# Patient Record
Sex: Male | Born: 1960 | Race: White | Hispanic: No | Marital: Married | Smoking: Current some day smoker
Health system: Southern US, Community
[De-identification: ages and names within clinical notes are randomized; demographics above are authoritative.]

## PROBLEM LIST (undated history)

## (undated) HISTORY — PX: FOOT SURGERY: SHX648

## (undated) HISTORY — PX: NECK SURGERY: SHX720

---

## 2006-04-22 ENCOUNTER — Emergency Department (HOSPITAL_COMMUNITY): Admission: EM | Admit: 2006-04-22 | Discharge: 2006-04-22 | Payer: Self-pay | Admitting: Emergency Medicine

## 2006-08-21 ENCOUNTER — Emergency Department (HOSPITAL_COMMUNITY): Admission: EM | Admit: 2006-08-21 | Discharge: 2006-08-21 | Payer: Self-pay | Admitting: Emergency Medicine

## 2006-10-05 ENCOUNTER — Emergency Department (HOSPITAL_COMMUNITY): Admission: EM | Admit: 2006-10-05 | Discharge: 2006-10-05 | Payer: Self-pay | Admitting: Emergency Medicine

## 2006-12-04 ENCOUNTER — Emergency Department (HOSPITAL_COMMUNITY): Admission: EM | Admit: 2006-12-04 | Discharge: 2006-12-04 | Payer: Self-pay | Admitting: Emergency Medicine

## 2007-05-03 ENCOUNTER — Emergency Department (HOSPITAL_COMMUNITY): Admission: EM | Admit: 2007-05-03 | Discharge: 2007-05-03 | Payer: Self-pay | Admitting: Emergency Medicine

## 2007-12-06 ENCOUNTER — Emergency Department (HOSPITAL_COMMUNITY): Admission: EM | Admit: 2007-12-06 | Discharge: 2007-12-06 | Payer: Self-pay | Admitting: Emergency Medicine

## 2008-06-26 ENCOUNTER — Emergency Department (HOSPITAL_COMMUNITY): Admission: EM | Admit: 2008-06-26 | Discharge: 2008-06-26 | Payer: Self-pay | Admitting: Emergency Medicine

## 2009-07-04 ENCOUNTER — Encounter: Admission: RE | Admit: 2009-07-04 | Discharge: 2009-07-04 | Payer: Self-pay | Admitting: Neurosurgery

## 2009-10-24 ENCOUNTER — Ambulatory Visit (HOSPITAL_COMMUNITY): Admission: RE | Admit: 2009-10-24 | Discharge: 2009-10-24 | Payer: Self-pay | Admitting: Neurosurgery

## 2009-11-13 ENCOUNTER — Encounter: Admission: RE | Admit: 2009-11-13 | Discharge: 2009-11-13 | Payer: Self-pay | Admitting: Neurosurgery

## 2010-06-01 LAB — CBC
Hemoglobin: 14.6 g/dL (ref 13.0–17.0)
Platelets: 226 10*3/uL (ref 150–400)
RBC: 4.47 MIL/uL (ref 4.22–5.81)
WBC: 6 10*3/uL (ref 4.0–10.5)

## 2010-06-01 LAB — SURGICAL PCR SCREEN: MRSA, PCR: NEGATIVE

## 2010-07-28 ENCOUNTER — Emergency Department (HOSPITAL_COMMUNITY)
Admission: EM | Admit: 2010-07-28 | Discharge: 2010-07-28 | Disposition: A | Discharge status: Home / Self Care | Payer: PRIVATE HEALTH INSURANCE | Attending: Emergency Medicine | Admitting: Emergency Medicine

## 2010-07-28 ENCOUNTER — Emergency Department (HOSPITAL_COMMUNITY): Payer: PRIVATE HEALTH INSURANCE

## 2010-07-28 DIAGNOSIS — S62639A Displaced fracture of distal phalanx of unspecified finger, initial encounter for closed fracture: Secondary | ICD-10-CM | POA: Insufficient documentation

## 2010-07-28 DIAGNOSIS — W230XXA Caught, crushed, jammed, or pinched between moving objects, initial encounter: Secondary | ICD-10-CM | POA: Insufficient documentation

## 2010-07-28 DIAGNOSIS — S61209A Unspecified open wound of unspecified finger without damage to nail, initial encounter: Secondary | ICD-10-CM | POA: Insufficient documentation

## 2010-12-07 LAB — BASIC METABOLIC PANEL
BUN: 9
CO2: 25
Calcium: 8.4
Chloride: 104
Creatinine, Ser: 0.58
GFR calc Af Amer: >60
GFR calc non Af Amer: >60
Glucose, Bld: 120 — ABNORMAL HIGH
Potassium: 4
Sodium: 136

## 2010-12-07 LAB — CSF CELL COUNT WITH DIFFERENTIAL
RBC Count, CSF: 1290 — ABNORMAL HIGH
RBC Count, CSF: 190 — ABNORMAL HIGH
Tube #: 1
Tube #: 4
WBC, CSF: 0
WBC, CSF: 0

## 2010-12-07 LAB — DIFFERENTIAL
Basophils Absolute: 0
Basophils Relative: 1
Eosinophils Absolute: 0.2
Eosinophils Relative: 3
Lymphocytes Relative: 10 — ABNORMAL LOW
Lymphs Abs: 0.5 — ABNORMAL LOW
Monocytes Absolute: 0.5
Monocytes Relative: 11
Neutro Abs: 3.7
Neutrophils Relative %: 76

## 2010-12-07 LAB — INFLUENZA A+B VIRUS AG-DIRECT(RAPID)
Inflenza A Ag: NEGATIVE
Influenza B Ag: NEGATIVE

## 2010-12-07 LAB — CSF CULTURE W GRAM STAIN
Culture: NO GROWTH
Gram Stain: NONE SEEN

## 2010-12-07 LAB — CBC
HCT: 36.4 — ABNORMAL LOW
Hemoglobin: 12.9 — ABNORMAL LOW
MCHC: 35.4
MCV: 92.7
Platelets: 211
RBC: 3.92 — ABNORMAL LOW
RDW: 13.6
WBC: 4.9

## 2010-12-07 LAB — GLUCOSE, CSF: Glucose, CSF: 58

## 2010-12-07 LAB — PROTEIN, CSF: Total  Protein, CSF: 39

## 2010-12-31 LAB — URINALYSIS, ROUTINE W REFLEX MICROSCOPIC
Nitrite: NEGATIVE
Protein, ur: NEGATIVE
Specific Gravity, Urine: 1.02
Urobilinogen, UA: 1

## 2010-12-31 LAB — DIFFERENTIAL
Basophils Relative: 0
Lymphs Abs: 2.4
Monocytes Absolute: 0.6
Neutro Abs: 3.6
Neutrophils Relative %: 53

## 2010-12-31 LAB — CBC
HCT: 46.2
Hemoglobin: 15.9
MCHC: 34.3
MCV: 94.1
Platelets: 305
RBC: 4.92
RDW: 13.6

## 2010-12-31 LAB — BASIC METABOLIC PANEL
BUN: 11
CO2: 29
Chloride: 102
Creatinine, Ser: 0.69
GFR calc non Af Amer: >60
Potassium: 4.3
Sodium: 137

## 2010-12-31 LAB — RAPID URINE DRUG SCREEN, HOSP PERFORMED
Barbiturates: NOT DETECTED
Opiates: NOT DETECTED

## 2011-08-09 ENCOUNTER — Ambulatory Visit
Admission: RE | Admit: 2011-08-09 | Discharge: 2011-08-09 | Disposition: A | Discharge status: Home / Self Care | Payer: PRIVATE HEALTH INSURANCE | Source: Ambulatory Visit | Attending: Physician Assistant | Admitting: Physician Assistant

## 2011-08-09 ENCOUNTER — Other Ambulatory Visit: Payer: Self-pay | Admitting: Physician Assistant

## 2011-08-09 DIAGNOSIS — M549 Dorsalgia, unspecified: Secondary | ICD-10-CM

## 2013-04-14 ENCOUNTER — Encounter (HOSPITAL_COMMUNITY): Payer: Self-pay | Admitting: Emergency Medicine

## 2013-04-14 ENCOUNTER — Emergency Department (HOSPITAL_COMMUNITY)
Admission: EM | Admit: 2013-04-14 | Discharge: 2013-04-14 | Disposition: A | Discharge status: Home / Self Care | Payer: PRIVATE HEALTH INSURANCE | Attending: Emergency Medicine | Admitting: Emergency Medicine

## 2013-04-14 DIAGNOSIS — S0993XA Unspecified injury of face, initial encounter: Secondary | ICD-10-CM | POA: Insufficient documentation

## 2013-04-14 DIAGNOSIS — Z9889 Other specified postprocedural states: Secondary | ICD-10-CM | POA: Insufficient documentation

## 2013-04-14 DIAGNOSIS — M542 Cervicalgia: Secondary | ICD-10-CM

## 2013-04-14 DIAGNOSIS — M62838 Other muscle spasm: Secondary | ICD-10-CM | POA: Insufficient documentation

## 2013-04-14 DIAGNOSIS — X500XXA Overexertion from strenuous movement or load, initial encounter: Secondary | ICD-10-CM | POA: Insufficient documentation

## 2013-04-14 DIAGNOSIS — S0990XA Unspecified injury of head, initial encounter: Secondary | ICD-10-CM | POA: Insufficient documentation

## 2013-04-14 DIAGNOSIS — Y9389 Activity, other specified: Secondary | ICD-10-CM | POA: Insufficient documentation

## 2013-04-14 DIAGNOSIS — Y929 Unspecified place or not applicable: Secondary | ICD-10-CM | POA: Insufficient documentation

## 2013-04-14 DIAGNOSIS — S199XXA Unspecified injury of neck, initial encounter: Principal | ICD-10-CM

## 2013-04-14 DIAGNOSIS — IMO0002 Reserved for concepts with insufficient information to code with codable children: Secondary | ICD-10-CM | POA: Insufficient documentation

## 2013-04-14 DIAGNOSIS — Z79899 Other long term (current) drug therapy: Secondary | ICD-10-CM | POA: Insufficient documentation

## 2013-04-14 DIAGNOSIS — F17200 Nicotine dependence, unspecified, uncomplicated: Secondary | ICD-10-CM | POA: Insufficient documentation

## 2013-04-14 MED ORDER — NAPROXEN 500 MG PO TABS: 500.0000 mg | ORAL_TABLET | Freq: Two times a day (BID) | ORAL | Status: DC

## 2013-04-14 MED ORDER — DIAZEPAM 5 MG PO TABS
5.0000 mg | ORAL_TABLET | Freq: Once | ORAL | Status: AC
  Administered 2013-04-14: 5 mg via ORAL
  Filled 2013-04-14: qty 1

## 2013-04-14 MED ORDER — HYDROCODONE-ACETAMINOPHEN 5-325 MG PO TABS: 1.0000 | ORAL_TABLET | Freq: Four times a day (QID) | ORAL | Status: DC | PRN

## 2013-04-14 MED ORDER — KETOROLAC TROMETHAMINE 60 MG/2ML IM SOLN
60.0000 mg | Freq: Once | INTRAMUSCULAR | Status: AC
  Administered 2013-04-14: 60 mg via INTRAMUSCULAR
  Filled 2013-04-14: qty 2

## 2013-04-14 MED ORDER — HYDROMORPHONE HCL PF 1 MG/ML IJ SOLN
1.0000 mg | Freq: Once | INTRAMUSCULAR | Status: AC
  Administered 2013-04-14: 1 mg via INTRAMUSCULAR
  Filled 2013-04-14: qty 1

## 2013-04-14 MED ORDER — METHOCARBAMOL 500 MG PO TABS: 500.0000 mg | ORAL_TABLET | Freq: Two times a day (BID) | ORAL | Status: DC

## 2013-04-14 NOTE — ED Notes (Signed)
Pt reports pain to the R side of his neck to his R shoulder and up to his head that started 2 weeks ago and became worse this morning. Pt has limited movement in turing neck from side to side and touching chin to neck. Previous surgery to correct a pinched nerve three years ago. Pt states that he has had a headache for about 3 weeks that is "not as bad but it throbs".

## 2013-04-14 NOTE — ED Provider Notes (Signed)
Medical screening examination/treatment/procedure(s) were performed by non-physician practitioner and as supervising physician I was immediately available for consultation/collaboration.  EKG Interpretation   None         Sunnie NielsenBrian Kerisha Goughnour, MD 04/14/13 2258

## 2013-04-14 NOTE — ED Provider Notes (Signed)
CSN: [id number]     Arrival date & time 04/14/13  0559 History   None    Chief Complaint  Patient presents with  . Neck Pain   (Consider location/radiation/quality/duration/timing/severity/associated sxs/prior Treatment) Patient is a 53 y.o. male presenting with neck pain.  Neck Pain Associated symptoms: no numbness, no photophobia and no weakness    53 yo male with PMH significant for Neck surgery 3 years ago presents with gradual onset of RIGHT sided neck pain that has worsened over the course of 3 weeks and now currently rated at 10/10. Patient describes turing his head and noted a popping sensation prior to the onset of his pain.  Pain is throbbing and states it feels like the "muscles are pulling". Patient has tried OTC aleve intermittently and hot pads with little relief. Patient denies any recent trauma or injury. Admits to decreased AROM due to pain and tightness. Admits to "slight" HA today. Denies fever/chills, recent illness, or sick contacts.  History reviewed. No pertinent past medical history. Past Surgical History  Procedure Laterality Date  . Neck surgery    . Foot surgery     History reviewed. No pertinent family history. History  Substance Use Topics  . Smoking status: Current Some Day Smoker  . Smokeless tobacco: Not on file  . Alcohol Use: No    Review of Systems  HENT: Negative for rhinorrhea.   Eyes: Negative for photophobia, pain and visual disturbance.  Musculoskeletal: Positive for back pain, neck pain and neck stiffness.  Skin: Negative for rash.  Neurological: Negative for weakness and numbness.    Allergies  Review of patient's allergies indicates no known allergies.  Home Medications   Current Outpatient Rx  Name  Route  Sig  Dispense  Refill  . Naproxen Sodium (ALEVE) 220 MG CAPS   Oral   Take 440 mg by mouth daily as needed. For pain         . varenicline (CHANTIX) 1 MG tablet   Oral   Take 1 mg by mouth 2 (two) times daily.          Marland Kitchen HYDROcodone-acetaminophen (NORCO) 5-325 MG per tablet   Oral   Take 1-2 tablets by mouth every 6 (six) hours as needed for moderate pain or severe pain.   10 tablet   0   . methocarbamol (ROBAXIN) 500 MG tablet   Oral   Take 1 tablet (500 mg total) by mouth 2 (two) times daily.   20 tablet   0   . naproxen (NAPROSYN) 500 MG tablet   Oral   Take 1 tablet (500 mg total) by mouth 2 (two) times daily.   30 tablet   0    BP 133/81  Pulse 72  Temp(Src) 98.3 F (36.8 C) (Oral)  Resp 20  Ht 5\' 7"  (1.702 m)  Wt 165 lb (74.844 kg)  BMI 25.84 kg/m2  SpO2 99% Physical Exam  Nursing note and vitals reviewed. Constitutional: He is oriented to person, place, and time. He appears well-developed and well-nourished. No distress.  HENT:  Head: Normocephalic and atraumatic.  Eyes: Conjunctivae and EOM are normal.  Neck: Trachea normal and phonation normal. Neck supple. No JVD present. Muscular tenderness (Right Trapezius. Notable tightness of Right musculature as compared to Left side. ) present. No spinous process tenderness present. Carotid bruit is not present. Decreased range of motion present. No edema and no erythema present. No mass and no thyromegaly present.  Cardiovascular: Normal rate and regular rhythm.  Exam reveals no gallop and no friction rub.   No murmur heard. Pulmonary/Chest: Effort normal and breath sounds normal. No respiratory distress. He has no wheezes. He has no rales.  Musculoskeletal: He exhibits no edema.  Neurological: He is alert and oriented to person, place, and time. He has normal strength. No cranial nerve deficit or sensory deficit.  Skin: Skin is warm and dry. He is not diaphoretic.  Psychiatric: He has a normal mood and affect. His behavior is normal.    ED Course  Procedures (including critical care time) Labs Review Labs Reviewed - No data to display Imaging Review No results found.  EKG Interpretation   None       MDM   1. Neck pain  on right side   2. Muscle spasms of neck    Patient afebrile with normal VS.  No hx of trauma.  Patient appears in NAD and non ill appearing. Doubt meningitis given hx and exam.   Plan to treat patient's pain in ED and send home with PO meds. Written out of work for today. Advised rest and light stretching as tolerated, with follow up with PCP in 2 days. Patient agrees with plan. Discharged good condition.   Meds given in ED:  Medications  ketorolac (TORADOL) injection 60 mg (60 mg Intramuscular Given 04/14/13 0708)  HYDROmorphone (DILAUDID) injection 1 mg (1 mg Intramuscular Given 04/14/13 0708)  diazepam (VALIUM) tablet 5 mg (5 mg Oral Given 04/14/13 0708)    New Prescriptions   HYDROCODONE-ACETAMINOPHEN (NORCO) 5-325 MG PER TABLET    Take 1-2 tablets by mouth every 6 (six) hours as needed for moderate pain or severe pain.   METHOCARBAMOL (ROBAXIN) 500 MG TABLET    Take 1 tablet (500 mg total) by mouth 2 (two) times daily.   NAPROXEN (NAPROSYN) 500 MG TABLET    Take 1 tablet (500 mg total) by mouth 2 (two) times daily.      Allen NorrisJacob Gray WaresboroLackey, PA-C 04/14/13 [phone]

## 2013-04-14 NOTE — Discharge Instructions (Signed)
Follow up with your primary care doctor in 2 days to ensure symptom improvement. Take medications as directed. Do not work or drive when taking prescription pain medication. Return to the ED should your symptoms worsen before being able to be seen by primary care doctor. Recommend light neck stretches as tolerated.    Emergency Department Resource Guide 1) Find a Doctor and Pay Out of Pocket Although you won't have to find out who is covered by your insurance plan, it is a good idea to ask around and get recommendations. You will then need to call the office and see if the doctor you have chosen will accept you as a new patient and what types of options they offer for patients who are self-pay. Some doctors offer discounts or will set up payment plans for their patients who do not have insurance, but you will need to ask so you aren't surprised when you get to your appointment.  2) Contact Your Local Health Department Not all health departments have doctors that can see patients for sick visits, but many do, so it is worth a call to see if yours does. If you don't know where your local health department is, you can check in your phone book. The CDC also has a tool to help you locate your state's health department, and many state websites also have listings of all of their local health departments.  3) Find a Walk-in Clinic If your illness is not likely to be very severe or complicated, you may want to try a walk in clinic. These are popping up all over the country in pharmacies, drugstores, and shopping centers. They're usually staffed by nurse practitioners or physician assistants that have been trained to treat common illnesses and complaints. They're usually fairly quick and inexpensive. However, if you have serious medical issues or chronic medical problems, these are probably not your best option.  No Primary Care Doctor: - Call Health Connect at  [phone] - they can help you locate a primary care  doctor that  accepts your insurance, provides certain services, etc. - Physician Referral Service- [phone]  Chronic Pain Problems: Organization         Address  Phone   Notes  [facility]  [phone] Patients need to be referred by their primary care doctor.   Medication Assistance: Organization         Address  Phone   Notes  [facility] Medication [facility] [address] [place] [place], [place] [address] [phone] --Must be a resident of [facility] -- Must have NO insurance coverage whatsoever (no Medicaid/ Medicare, etc.) -- The pt. MUST have a primary care doctor that directs their care regularly and follows them in the community   [facility]  [phone]   [employer]  [phone]    Agencies that provide inexpensive medical care: Organization         Address  Phone   Notes  [facility]  [phone]   [facility]    [phone]   [facility] [address] [place], [place] [address] [phone]   [facility] [address] [place] [place] [phone]   Planned Parenthood    [phone]   [facility]    [phone]   [facility]  [address], [place] Phone:  [phone], Fax:  [phone] Hours of  Operation:  9 am - 6 pm, M-F.  Also accepts Medicaid/Medicare and self-pay.  [facility] for [place], [address], [place] Phone: [phone], Fax: [phone]. Hours of Operation:  8:30 am - 5:30 pm, M-F.  Also accepts Medicaid and self-pay.  [facility] [place] [address] [place] Phone: [phone]   [place], [place], [place] [phone], Ext. [phone] Mondays & Thursdays: 7-9 AM.  First 15 patients are seen on a first come, first serve basis.    [place]  Providers:  Organization         Address  Phone   Notes  [facility] [address], [place] [phone] Also accepts self-pay patients.  [facility] [address] [place], [place]  [phone]   [place], [address], [place] [phone]   [facility] [address] [place] [phone]   Lucianne Lei [address], [place]   [phone] Only accepts [place] Access [place] patients after they have their name applied to their card.   Self-Pay (no insurance) in [facility]:  Organization         Address  Phone   Notes  Sickle Cell Patients, [facility] Internal Medicine [place] [phone]   [facility] [place] [phone]   [facility] [place]  [place], [place], [place] [phone]   [facility]/Dr. Osei-Bonsu  [address] [place] or [address], [place] [phone] Phone number for both [place] and [place] locations is the same.  [facility] [address] [place] [phone]   [facility] [address] [place] or [address] [phone]   [facility] [address] [place] [phone], phone; [phone], fax Sees patients 1st and 3rd Saturday of every month.  Must not qualify for public or private insurance (i.e. Medicaid, Medicare, [place] Health Choice, Veterans' Benefits)  Household income should be no more than 200% of the poverty level The clinic cannot treat you if you are pregnant or think you are pregnant  Sexually transmitted diseases are not treated at the clinic.    Dental Care: Organization         Address  Phone  Notes  [facility] [place] [phone] Accepts children up to age 91 who are enrolled in [place] or [place]; pregnant women with a Medicaid card; and children who have applied for Medicaid or [place] Health Choice, but were declined, whose parents can pay a reduced fee at time of service.  [facility] Department of [facility]  [address], [place] [phone] Accepts children up to age 110 who are enrolled in [place] or [place]; pregnant women with a Medicaid card; and children who have applied for Medicaid or [place] Health Choice, but were declined, whose parents can pay a reduced fee at time of service.  [facility] Adult [facility] Access PRO[facility]  [place] [phone] Patients are seen by appointment only. Walk-ins are not accepted. [place] will see patients  101 years of age and older. Monday - Tuesday (8am-5pm) Most Wednesdays (8:30-5pm) $30 per visit, cash only  [facility] Adult Dental Access PROGRAM  [address], [facility] [phone] Patients are seen by appointment only. Walk-ins are not accepted. [place] will see patients 9 years of age and older. One Wednesday Evening (Monthly: Volunteer Based).  $30 per visit, cash only  [place]  [phone] for adults; Children under age 42, call Graduate Pediatric Dentistry at [phone]. Children aged 42-14, please call [phone] to request a pediatric application.  Dental services are provided in all areas of dental care including fillings, crowns and bridges, complete and partial dentures, implants, gum treatment, root canals, and extractions. Preventive care is also provided. Treatment is provided to both adults and children. Patients are selected via a lottery and there is often a waiting list.   [facility] [address] [place]  [phone] [email or web address]   [facility]  [address] [place], [place] [phone], Ext. [phone] Second and Fourth Thursday of each month, opens at 6:30 AM; Clinic ends at 9 AM.  Patients are seen on a first-come first-served basis, and a limited number are seen during each clinic.   [facility]  [address] Hillard Danker [place], [place] [phone]   Eligibility Requirements You must have lived in [place], [place], or [place] counties for at least the last three months.   You cannot be eligible for state or federal sponsored [employer], including [employer], [place], or [employer].   You generally cannot be eligible for healthcare insurance through your employer.    How to apply: Eligibility screenings are held every Tuesday and Wednesday afternoon from 1:00 pm until 4:00 pm. You do not need an appointment for the interview!  [facility] [address] [place], [place]   [place]  [place] Department  [place]  [phone]    Behavioral Health Resources in the Community: Intensive Outpatient Programs Organization         Address  Phone  Notes  [facility]. [address], [place], [place] [phone]   [facility]patient [address], [place], [place]   ADS: Alcohol & Drug Svcs [address], [place], [place]   [place] [address]  [place], [place] or [phone]   Substance Abuse Resources Organization         Address  Phone  Notes  Alcohol and Drug Services  [phone]   [place]  [phone]   [facility] [place]   Chinita Pester  [phone]   Residential & Outpatient Substance Abuse Program  [phone]   Psychological Services Organization         Address  Phone  Notes  [facility]  [place]  [phone]   [place] [address] [place] or [phone]    Mobile Crisis Teams Organization         Address  Phone  Notes  [facility], Mobile Crisis Care Unit  [phone]   [facility]  [address] [place], [place]   [facility] [address] [place] [phone]    Self-Help/Support Groups Organization  Address  Phone             Notes  Mental Health Assoc. of  - variety of support groups  336- [phone] Call for more information  Narcotics Anonymous (NA), [facility] [address], [place]  2 meetings at this location   Statisticianesidential Treatment Programs Organization         Address  Phone  Notes  [facility] [address],    [place]  [phone]   [facility]  [address], [place] [phone], [place], [place] [phone]   [facility] [address], [place] [phone] Admissions: 8am-3pm M-F  [facility] [address]    [place], [place] [phone]   [facility] [address], [place], [place] [phone]   [facility] [address]  [place], [place] [phone]   [facility] - Intensive Outpatient [address], [place], [place] [phone]   [facility] [address]  [place], [place] [phone] or [phone]   [facility] (RTS) [address] [place], [place] [phone] Accepts Medicaid  [facility] [address]  [place] [phone] Substance Abuse/Addiction Treatment   [facility] Behavioral Health Resources Organization         Address  Phone  Notes  [facility]  [phone]) [phone]   Angie FavaJulie Brannon, PhD [address] [place] [address], [address] [place], [place]   [phone] or [phone](336) [phone]    [facility]   [address] [place], [place] [phone]) [phone]   [facility] [address], [place], [place] [phone] Insurance/Medicaid/sponsorship through [facility]  [facility] Families [address]                                    [place], [place] [phone]) [phone] Therapy/tele-psych/case  [facility] [address]   [place], [place] [phone]) [phone]    Dr. Lolly MustacheArfeen  [phone]   [facility]  [employer] [facility] 1) [address], [place] 2) [address], [place] 3)  [address], [place] [phone]) [phone]  [phone]   [facility] Child Abuse Hotline [phone](336) [phone] or [phone]) [phone] (After Hours)     =

## 2015-06-19 ENCOUNTER — Ambulatory Visit (INDEPENDENT_AMBULATORY_CARE_PROVIDER_SITE_OTHER): Payer: PRIVATE HEALTH INSURANCE

## 2015-06-19 ENCOUNTER — Ambulatory Visit (INDEPENDENT_AMBULATORY_CARE_PROVIDER_SITE_OTHER): Payer: PRIVATE HEALTH INSURANCE | Admitting: Physician Assistant

## 2015-06-19 VITALS — BP 132/76 | HR 67 | Temp 98.0°F | Resp 16 | Ht 71.0 in | Wt 155.0 lb

## 2015-06-19 DIAGNOSIS — M25562 Pain in left knee: Secondary | ICD-10-CM

## 2015-06-19 MED ORDER — TRAMADOL HCL 50 MG PO TABS: 50.0000 mg | ORAL_TABLET | Freq: Three times a day (TID) | ORAL | Status: AC | PRN

## 2015-06-19 NOTE — Progress Notes (Signed)
Subjective:   Patient ID: Jeremy Herring, male     DOB: 1960-07-24, 55 y.o.    MRN: [id number]  PCP: No PCP Per Patient  Chief Complaint  Patient presents with  . Knee Injury    x Sat. left knee    HPI  Presents for evaluation of left knee pain.   Patient states he was mowing the grass on Saturday (06/17/15) when a rock flew out of the lawnmower and hit his left knee. He sustained a small puncture wound to the medial inferior aspect of the patella with small amount of bruising. He elevated and iced is left leg with little relief. He tried ibuprofen yesterday without relief. Walking makes the pain worse. Nothing makes better. He rates the pain a 9/10. 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 Denies fever, chills, cp, sob, numbness/tingling of LLE. Endorses pain, bruising, and edema of left knee. He is unable to bear weight on his left leg and ROM is restricted. Patient take no medications regularly and has no allergies.    NO CURRENT MEDICATIONS.   No Known Allergies   There are no active problems to display for this patient.    History reviewed. No pertinent family history.   Social History   Social History  . Marital Status: Married    Spouse Name: N/A  . Number of Children: 2  . Years of Education: N/A   Occupational History  . Construction    Social History Main Topics  . Smoking status: Current Some Day Smoker -- 1.00 packs/day for 20 years    Types: Cigarettes  . Smokeless tobacco: Never Used  . Alcohol Use: 7.2 oz/week    12 Cans of beer per week  . Drug Use: No  . Sexual Activity: Yes   Other Topics Concern  . Not on file   Social History Narrative   Live at home with wife        Review of Systems Constitutional: Negative for fever and chills.  Respiratory: Negative for shortness of breath.  Cardiovascular: Negative for chest pain.  Gastrointestinal: Negative for abdominal pain.  Musculoskeletal:    Unable to bear significant weight on left leg  Skin:   Bruising and swelling inferiormedial to left patella       Objective:  Physical Exam  Constitutional: He is oriented to person, place, and time. He appears well-developed and well-nourished. He is active and cooperative. No distress.  BP 132/76 mmHg  Pulse 67  Temp(Src) 98 F (36.7 C)  Resp 16  Ht  (1.803 m)  Wt 155 lb (70.308 kg)  BMI 21.63 kg/m2   Eyes: Conjunctivae are normal.  Cardiovascular:  Pulses:      Posterior tibial pulses are 2+ on the left side.  Pulmonary/Chest: Effort normal.  Musculoskeletal:       Left knee: He exhibits decreased range of motion (due to pain), swelling (inferomedially), ecchymosis and bony tenderness. He exhibits no effusion, no deformity, no erythema, normal alignment, no LCL laxity, normal patellar mobility, normal meniscus and no MCL laxity. Lacerations: small eschar inferomedially, consistent with wound from object as described. Tenderness found. Medial joint line and patellar tendon tenderness noted. No lateral joint line, no MCL and no LCL tenderness noted.       Legs: Neurological: He is alert and oriented to person, place, and time.  Skin: Ecchymosis and lesion (wound with eschar) noted. No rash noted. No erythema.  Psychiatric:  He has a normal mood and affect. His speech is normal and behavior is normal.    Dg Knee Complete 4 Views Left  06/19/2015  CLINICAL DATA:  Hit by a rock 2 days ago while mowing. Infra medial patellar wounds initial encounter. EXAM: LEFT KNEE - COMPLETE 4+ VIEW COMPARISON:  None. FINDINGS: Negative for acute fracture, subluxation, or definitive joint effusion. The infrapatellar soft tissues appear indistinct, correlating with history of swelling in this region. Mild degenerative marginal spurring without detectable joint narrowing. There is mild medullary sclerosis at the level of the distal femoral diaphysis without chondroid matrix or typical  osteonecrosis pattern. No indication of aggressive lesion. IMPRESSION: Soft tissue swelling without acute osseous finding. Electronically Signed   By: Marnee SpringJonathon  Watts M.D.   On: 06/19/2015 10:41            Assessment & Plan:  1. Left knee pain Due to contusion from object "spit" by lawnmower. No radiologic evidence of bony deformity. Supportive care: rest, ice, elevate, Ultram (50 mg, 1-2 PO Q8 hours PRN, #30). Out of work today. Declined crutches. RTW tomorrow, or notify me. RTC if symptoms worsen/persist 7-10 days. - DG Knee Complete 4 Views Left; Future   Fernande Brashelle S. Iokepa Geffre, PA-C Physician Assistant-Certified [facility]

## 2015-06-19 NOTE — Patient Instructions (Addendum)
Rest today and limit weight bearing as tolerated. Keep left leg elevated, apply compression and ice to help with swelling and pain. Take ultram as needed for pain. No work restrictions. Increase weight bearing as tolerated. Please call or return to the clinic for questions or if symptoms fail to improve or worsen in 7-10 days.     IF you received an x-ray today, you will receive an [facility]. Please contact [facility] at [phone] with questions or concerns regarding your invoice.   IF you received labwork today, you will receive an invoice from [facility]. Please contact [facility] at [phone] with questions or concerns regarding your invoice.   Our billing staff will not be able to assist you with questions regarding bills from these companies.  You will be contacted with the lab results as soon as they are available. The fastest way to get your results is to activate your My Chart account. Instructions are located on the last page of this paperwork. If you have not heard from us regarding the results in 2 weeks, please contact this office.

## 2015-06-19 NOTE — Progress Notes (Signed)
   Subjective:    Patient ID: Jeremy Herring, male    DOB: [birth year], 55 y.o.   MRN: [id number]  HPI  Mr. Jeremy Herring is a 55 yo male with no significant pmh that presented today for left knee pain. Patient states he was mowing the grass on Saturday when a rock flew out of the lawnmower and hit his left knee. He sustained a small puncture wound to the medial inferiorlateral aspect of the patella with small amount of bruising. He elevated and iced is left leg with little relief. He tried ibuprofen yesterday without relief. Walking makes the pain worse. Nothing makes better. He rates the pain a 9/10. 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 Denies fever, chills, cp, sob, numbness/tingling of LLE. Endorses pain, bruising, and edema of left knee. He is unable to bear weight on his left leg and ROM is restricted. Patient take no medications regularly and has no allergies.    Review of Systems  Constitutional: Negative for fever and chills.  Respiratory: Negative for shortness of breath.   Cardiovascular: Negative for chest pain.  Gastrointestinal: Negative for abdominal pain.  Musculoskeletal:       Unable to bear significant weight on left leg  Skin:       Bruising and swelling of left medial inferiorlateral patella       Objective:   Physical Exam  Constitutional: He appears well-developed and well-nourished. No distress.  Patient is uncomfortable due to pain  Cardiovascular: Normal rate and regular rhythm.   Pulses:      Dorsalis pedis pulses are 2+ on the right side, and 2+ on the left side.  Pulmonary/Chest: Effort normal and breath sounds normal.  Abdominal: Soft. Bowel sounds are normal.  Musculoskeletal:       Left hip: Normal.       Right knee: Normal.       Left knee: He exhibits decreased range of motion (Due to pain), swelling, ecchymosis and laceration (Small puncture wound medial inferiorlateral aspect of patella that has healed). He  exhibits no effusion, no deformity, normal alignment, no LCL laxity, normal patellar mobility and no MCL laxity. Tenderness found. Patellar tendon tenderness noted.       Left ankle: Normal.  Strength 5/5 in RLE and 4/5 in LLE due to pain. Full passive ROM and restricted active ROM of left knee.  Skin: Skin is warm and dry. Bruising: Medial inferior lateral aspect of patella.          Assessment & Plan:  Mr. Jeremy Herring is a [age] yo male with no significant PMH, takes no medications, and has no allergies that is here today for left knee pain. Patient is unable to bear significant weight on his left leg due to the pain. It is unlikely that there is any fracture due to the mechanism, but will xray left knee to r/o fracture. He has no ligamentous laxiy of left knee including, MCL, LCL, ACL and PCL. Xray without acute fracture.  1. Left knee pain:  - Prescribe Ultram for pain and swelling  - Weight bearing as tolerated  - RICE instructions provided  - Work excuse given for today  Please call or return to clinic if the pain gets worse or fails to improve in 7-10 days.

## 2016-05-31 ENCOUNTER — Other Ambulatory Visit: Payer: Self-pay | Admitting: Internal Medicine

## 2016-05-31 DIAGNOSIS — R911 Solitary pulmonary nodule: Secondary | ICD-10-CM

## 2016-06-06 ENCOUNTER — Ambulatory Visit
Admission: RE | Admit: 2016-06-06 | Discharge: 2016-06-06 | Disposition: A | Discharge status: Home / Self Care | Payer: PRIVATE HEALTH INSURANCE | Source: Ambulatory Visit | Attending: Internal Medicine | Admitting: Internal Medicine

## 2016-06-06 DIAGNOSIS — R911 Solitary pulmonary nodule: Secondary | ICD-10-CM

## 2016-06-06 MED ORDER — IOPAMIDOL (ISOVUE-300) INJECTION 61%
75.0000 mL | Freq: Once | INTRAVENOUS | Status: AC | PRN
  Administered 2016-06-06: 75 mL via INTRAVENOUS

## 2019-01-12 IMAGING — CT CT CHEST W/ CM
2 series · 15 of 32 positions shown, 17 images · IV contrast (APPLIED)
Comparison: Chest radiograph 05/24/2016

CLINICAL DATA: Abnormal chest radiograph with pulmonary nodule,
smoker

EXAM:
CT CHEST WITH CONTRAST
TECHNIQUE: Multidetector CT imaging of the chest was performed during
intravenous contrast administration.
CONTRAST:  75mL [id number] IOPAMIDOL ([id number]) INJECTION 61%

[Series 2: chest w/cm · axial · 0.72mm/px · z∈[+968,+1250]mm · 7 of 199 slices shown, 9 images]
[im 29/199  mediastinal]
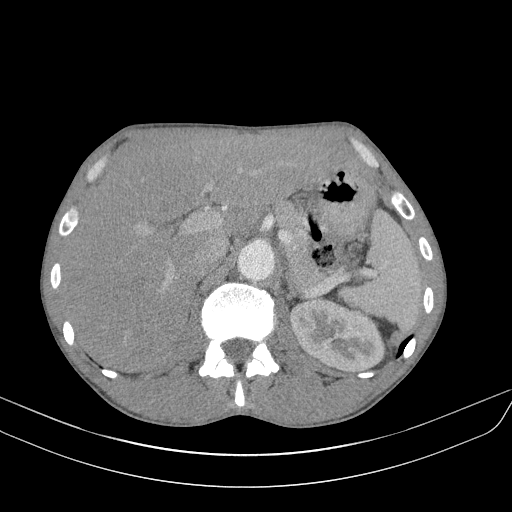
[im 29/199  lung]
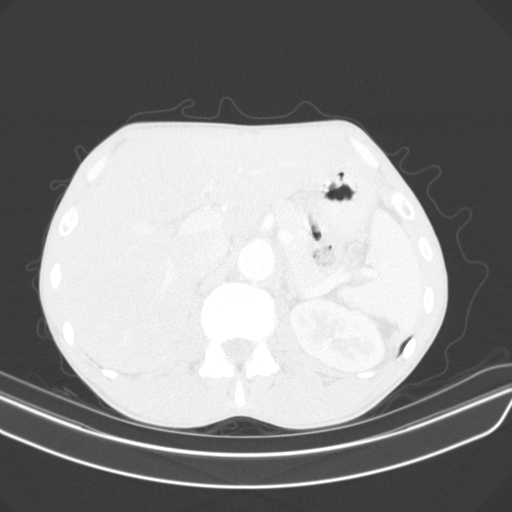
[im 57/199  lung]
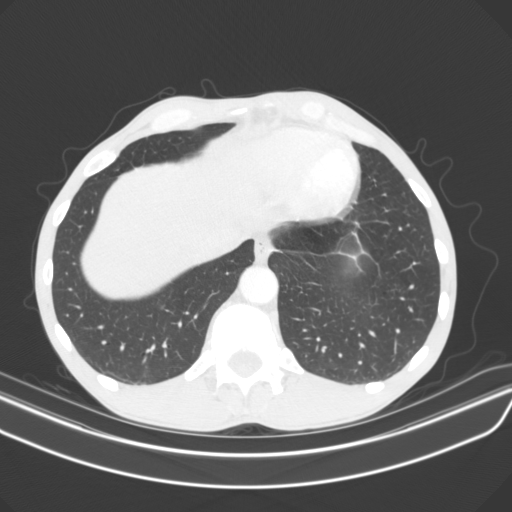
[im 85/199  lung]
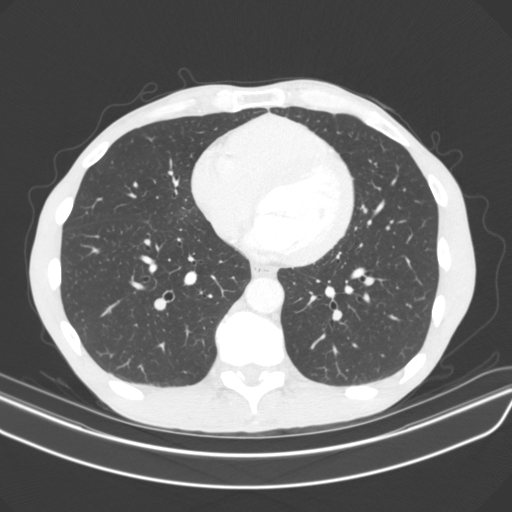
[im 95/199  lung]
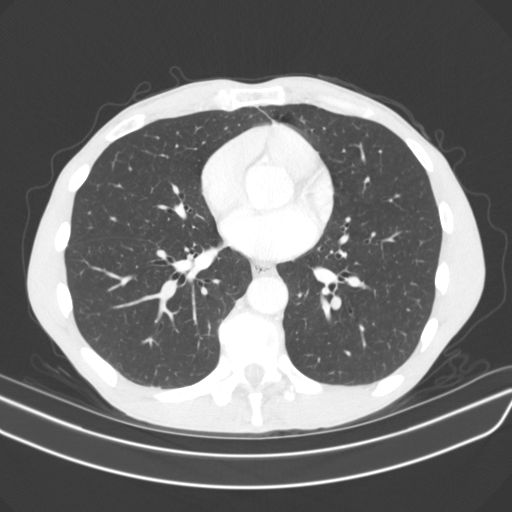
[im 114/199  mediastinal]
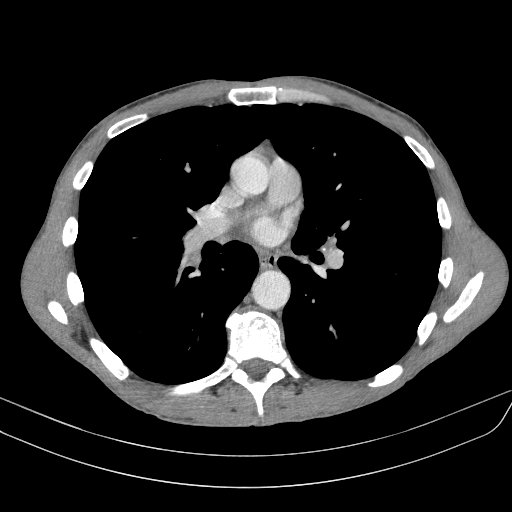
[im 114/199  lung]
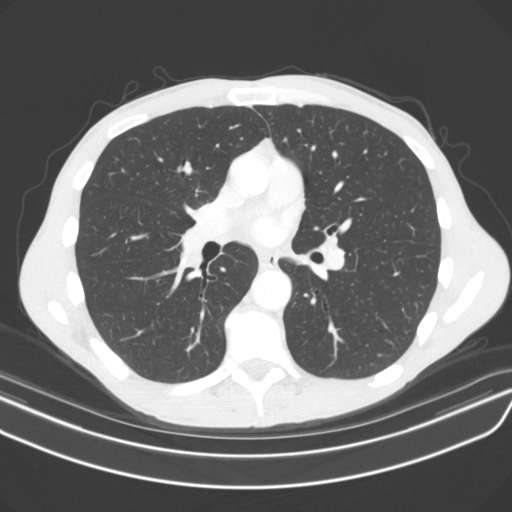
[im 142/199  lung]
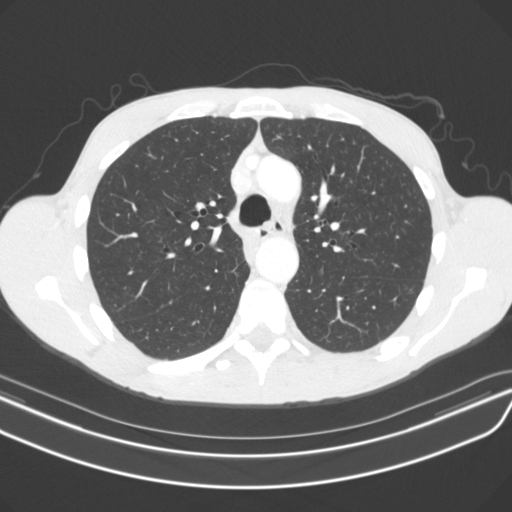
[im 170/199  lung]
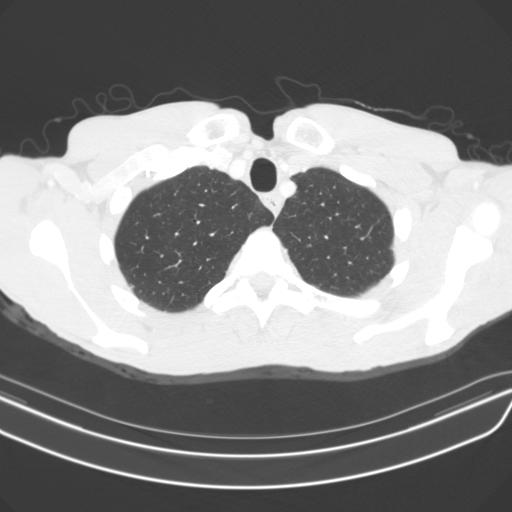

[Series 6: super d · axial · 0.72mm/px · z∈[+1001,+1272]mm · 8 of 491 slices shown]
[im 52/491  lung]
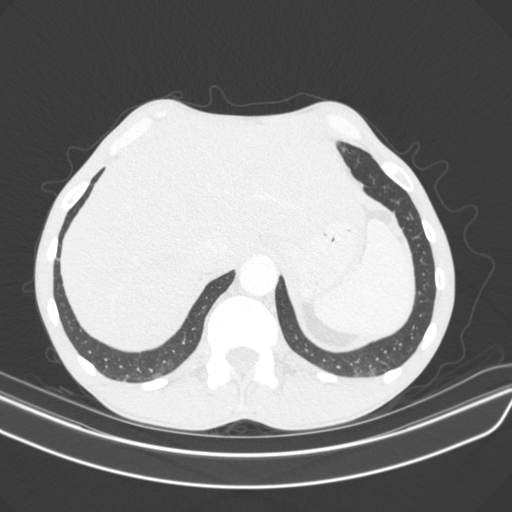
[im 104/491  lung]
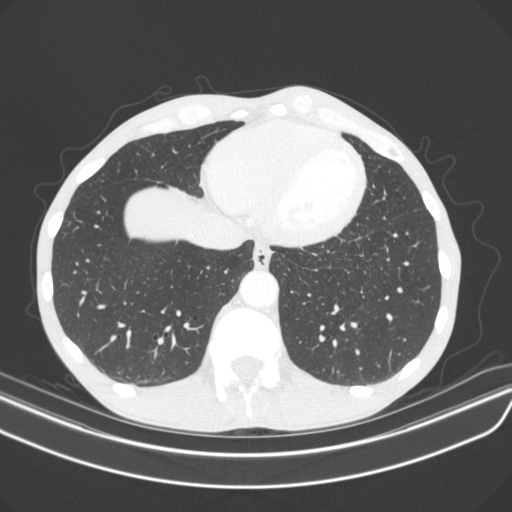
[im 164/491  lung]
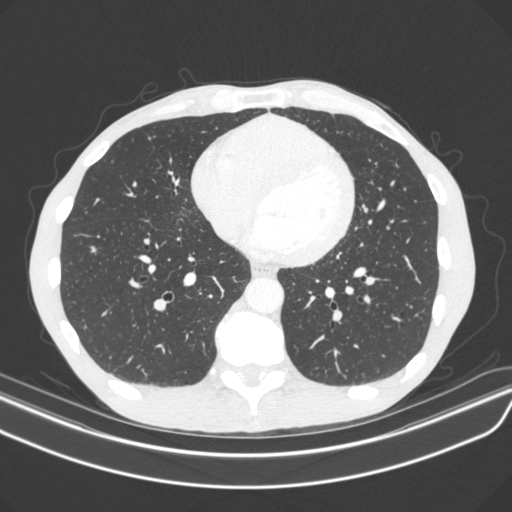
[im 207/491  lung]
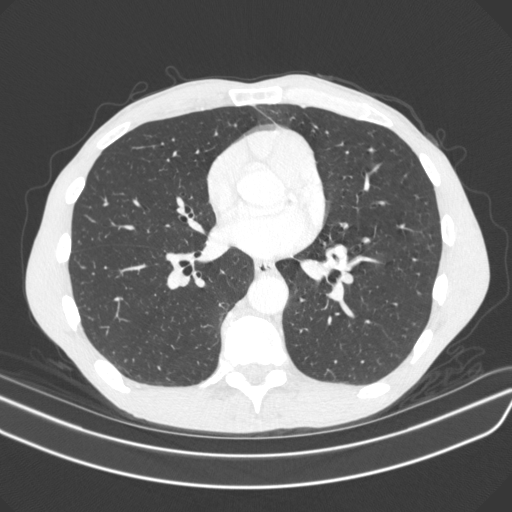
[im 258/491  lung]
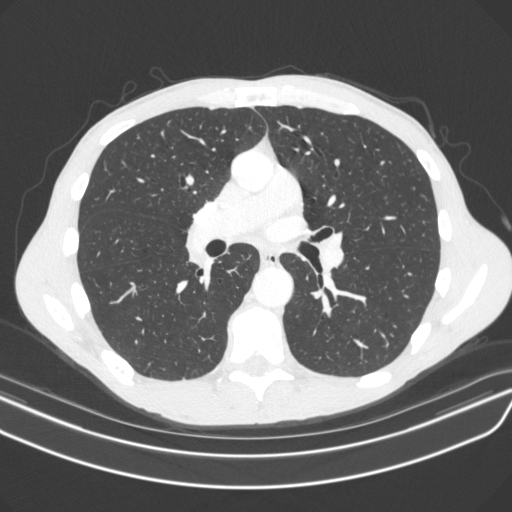
[im 327/491  lung]
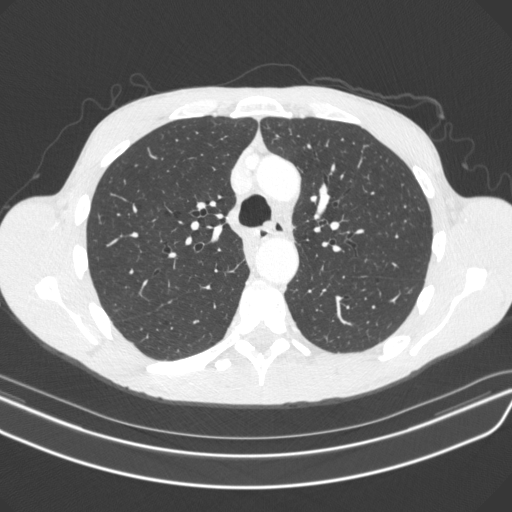
[im 387/491  lung]
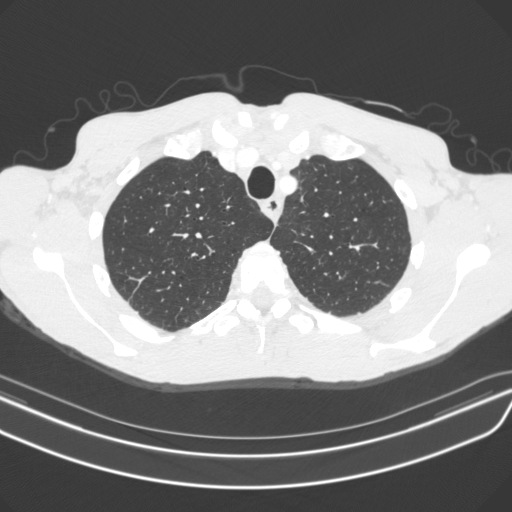
[im 439/491  lung]
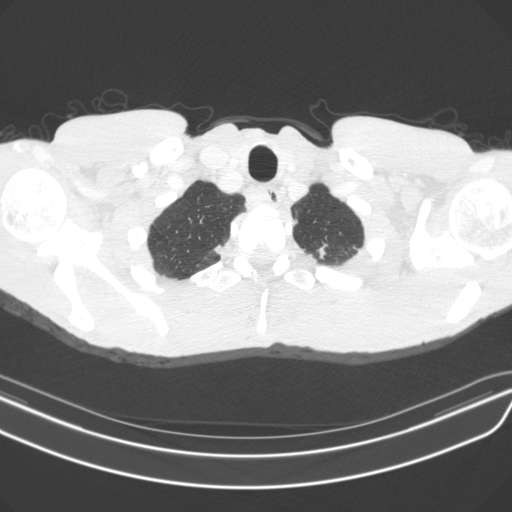

[15 of 32 positions shown; findings below may reference images not displayed]

FINDINGS: Cardiovascular: Thoracic vascular structures grossly patent on
nondedicated exam. Aorta normal caliber. No pericardial effusion.

Mediastinum/Nodes: Esophagus normal appearance. Visualized base of
cervical region unremarkable. Scattered normal sized mediastinal
lymph nodes. Minimally enlarged subcarinal node 13 mm short axis
image 73. No additional thoracic adenopathy.

Lungs/Pleura: Minimal biapical scarring. Densely calcified granuloma
at LEFT lung base corresponding to radiographic finding image 140.
No pulmonary infiltrate, pleural effusion or pneumothorax. No
additional pulmonary mass or nodule.

Upper Abdomen: Unremarkable

Musculoskeletal: No acute osseous findings.
IMPRESSION: Calcified granulomata lower lobe corresponding to radiographic
finding.

Single nonspecific minimally enlarged subcarinal lymph node.

Otherwise negative exam.
